# Patient Record
Sex: Female | Born: 1965 | Hispanic: Yes | Marital: Married | Smoking: Current some day smoker
Health system: Southern US, Community
[De-identification: ages and names within clinical notes are randomized; demographics above are authoritative.]

## PROBLEM LIST (undated history)

## (undated) DIAGNOSIS — E039 Hypothyroidism, unspecified: Secondary | ICD-10-CM

## (undated) HISTORY — DX: Hypothyroidism, unspecified: E03.9

---

## 2016-08-25 ENCOUNTER — Other Ambulatory Visit: Payer: Self-pay | Admitting: Family Medicine

## 2016-08-25 ENCOUNTER — Other Ambulatory Visit (HOSPITAL_COMMUNITY)
Admission: RE | Admit: 2016-08-25 | Discharge: 2016-08-25 | Disposition: A | Discharge status: Home / Self Care | Payer: 59 | Source: Ambulatory Visit | Attending: Family Medicine | Admitting: Family Medicine

## 2016-08-25 DIAGNOSIS — Z1151 Encounter for screening for human papillomavirus (HPV): Secondary | ICD-10-CM | POA: Insufficient documentation

## 2016-08-25 DIAGNOSIS — Z01419 Encounter for gynecological examination (general) (routine) without abnormal findings: Secondary | ICD-10-CM | POA: Insufficient documentation

## 2016-09-01 LAB — CYTOLOGY - PAP
DIAGNOSIS: NEGATIVE
HPV (WINDOPATH): DETECTED — AB

## 2017-10-13 ENCOUNTER — Other Ambulatory Visit (HOSPITAL_COMMUNITY)
Admission: RE | Admit: 2017-10-13 | Discharge: 2017-10-13 | Disposition: A | Discharge status: Home / Self Care | Payer: PRIVATE HEALTH INSURANCE | Source: Ambulatory Visit | Attending: Family Medicine | Admitting: Family Medicine

## 2017-10-13 ENCOUNTER — Other Ambulatory Visit: Payer: Self-pay | Admitting: Family Medicine

## 2017-10-13 DIAGNOSIS — Z01411 Encounter for gynecological examination (general) (routine) with abnormal findings: Secondary | ICD-10-CM | POA: Diagnosis not present

## 2017-10-18 LAB — CYTOLOGY - PAP
DIAGNOSIS: NEGATIVE
HPV: NOT DETECTED

## 2018-04-26 ENCOUNTER — Other Ambulatory Visit: Payer: Self-pay | Admitting: Family Medicine

## 2018-04-26 DIAGNOSIS — Z1231 Encounter for screening mammogram for malignant neoplasm of breast: Secondary | ICD-10-CM

## 2018-05-16 ENCOUNTER — Ambulatory Visit
Admission: RE | Admit: 2018-05-16 | Discharge: 2018-05-16 | Disposition: A | Discharge status: Home / Self Care | Payer: PRIVATE HEALTH INSURANCE | Source: Ambulatory Visit | Attending: Family Medicine | Admitting: Family Medicine

## 2018-05-16 ENCOUNTER — Ambulatory Visit: Payer: PRIVATE HEALTH INSURANCE

## 2018-05-16 DIAGNOSIS — Z1231 Encounter for screening mammogram for malignant neoplasm of breast: Secondary | ICD-10-CM

## 2018-05-17 ENCOUNTER — Other Ambulatory Visit: Payer: Self-pay | Admitting: Family Medicine

## 2018-05-17 DIAGNOSIS — R928 Other abnormal and inconclusive findings on diagnostic imaging of breast: Secondary | ICD-10-CM

## 2018-05-22 ENCOUNTER — Ambulatory Visit
Admission: RE | Admit: 2018-05-22 | Discharge: 2018-05-22 | Disposition: A | Discharge status: Home / Self Care | Payer: PRIVATE HEALTH INSURANCE | Source: Ambulatory Visit | Attending: Family Medicine | Admitting: Family Medicine

## 2018-05-22 ENCOUNTER — Other Ambulatory Visit: Payer: Self-pay | Admitting: Family Medicine

## 2018-05-22 DIAGNOSIS — R928 Other abnormal and inconclusive findings on diagnostic imaging of breast: Secondary | ICD-10-CM

## 2018-05-22 DIAGNOSIS — N6320 Unspecified lump in the left breast, unspecified quadrant: Secondary | ICD-10-CM

## 2018-11-27 ENCOUNTER — Ambulatory Visit
Admission: RE | Admit: 2018-11-27 | Discharge: 2018-11-27 | Disposition: A | Discharge status: Home / Self Care | Payer: PRIVATE HEALTH INSURANCE | Source: Ambulatory Visit | Attending: Family Medicine | Admitting: Family Medicine

## 2018-11-27 DIAGNOSIS — N6320 Unspecified lump in the left breast, unspecified quadrant: Secondary | ICD-10-CM

## 2019-05-08 ENCOUNTER — Other Ambulatory Visit: Payer: Self-pay | Admitting: Family Medicine

## 2019-05-08 DIAGNOSIS — N63 Unspecified lump in unspecified breast: Secondary | ICD-10-CM

## 2019-05-18 ENCOUNTER — Other Ambulatory Visit: Payer: Self-pay

## 2019-05-18 ENCOUNTER — Ambulatory Visit
Admission: RE | Admit: 2019-05-18 | Discharge: 2019-05-18 | Disposition: A | Discharge status: Home / Self Care | Payer: BC Managed Care – PPO | Source: Ambulatory Visit | Attending: Family Medicine | Admitting: Family Medicine

## 2019-05-18 ENCOUNTER — Ambulatory Visit
Admission: RE | Admit: 2019-05-18 | Discharge: 2019-05-18 | Disposition: A | Discharge status: Home / Self Care | Payer: PRIVATE HEALTH INSURANCE | Source: Ambulatory Visit | Attending: Family Medicine | Admitting: Family Medicine

## 2019-05-18 DIAGNOSIS — N63 Unspecified lump in unspecified breast: Secondary | ICD-10-CM

## 2019-09-12 ENCOUNTER — Other Ambulatory Visit: Payer: Self-pay | Admitting: Family Medicine

## 2019-09-12 DIAGNOSIS — E039 Hypothyroidism, unspecified: Secondary | ICD-10-CM

## 2019-09-12 DIAGNOSIS — M7989 Other specified soft tissue disorders: Secondary | ICD-10-CM

## 2019-09-20 ENCOUNTER — Ambulatory Visit
Admission: RE | Admit: 2019-09-20 | Discharge: 2019-09-20 | Disposition: A | Discharge status: Home / Self Care | Payer: BC Managed Care – PPO | Source: Ambulatory Visit | Attending: Family Medicine | Admitting: Family Medicine

## 2019-09-20 DIAGNOSIS — M7989 Other specified soft tissue disorders: Secondary | ICD-10-CM

## 2019-09-20 DIAGNOSIS — E039 Hypothyroidism, unspecified: Secondary | ICD-10-CM

## 2019-10-11 ENCOUNTER — Other Ambulatory Visit: Payer: Self-pay | Admitting: Family Medicine

## 2019-10-11 DIAGNOSIS — E042 Nontoxic multinodular goiter: Secondary | ICD-10-CM

## 2019-10-17 ENCOUNTER — Other Ambulatory Visit: Payer: Self-pay

## 2019-10-17 ENCOUNTER — Ambulatory Visit
Admission: RE | Admit: 2019-10-17 | Discharge: 2019-10-17 | Disposition: A | Discharge status: Home / Self Care | Payer: BC Managed Care – PPO | Source: Ambulatory Visit | Attending: Family Medicine | Admitting: Family Medicine

## 2019-10-17 ENCOUNTER — Other Ambulatory Visit (HOSPITAL_COMMUNITY)
Admission: RE | Admit: 2019-10-17 | Discharge: 2019-10-17 | Disposition: A | Discharge status: Home / Self Care | Payer: BC Managed Care – PPO | Source: Ambulatory Visit | Attending: Radiology | Admitting: Radiology

## 2019-10-17 DIAGNOSIS — E042 Nontoxic multinodular goiter: Secondary | ICD-10-CM | POA: Diagnosis not present

## 2019-10-18 LAB — CYTOLOGY - NON PAP

## 2019-11-01 ENCOUNTER — Encounter (HOSPITAL_COMMUNITY): Payer: Self-pay

## 2019-12-01 ENCOUNTER — Ambulatory Visit: Payer: BC Managed Care – PPO | Attending: Internal Medicine

## 2019-12-01 DIAGNOSIS — Z23 Encounter for immunization: Secondary | ICD-10-CM

## 2019-12-01 NOTE — Progress Notes (Signed)
   Covid-19 Vaccination Clinic  Name:  Mallory Moore    MRN: [id number] DOB: [birth year]  12/01/2019  Mallory Moore was observed post Covid-19 immunization for 15 minutes without incidence. She was provided with Vaccine Information Sheet and instruction to access the V-Safe system.   Mallory Moore was instructed to call 911 with any severe reactions post vaccine: Marland Kitchen Difficulty breathing  . Swelling of your face and throat  . A fast heartbeat  . A bad rash all over your body  . Dizziness and weakness    Immunizations Administered    Name Date Dose VIS Date Route   Pfizer COVID-19 Vaccine 12/01/2019  3:57 PM 0.3 mL 09/14/2019 Intramuscular   Manufacturer: [employer], [employer]   Lot: [id number]   NDC: [address]-7

## 2019-12-22 ENCOUNTER — Ambulatory Visit: Payer: BC Managed Care – PPO | Attending: Internal Medicine

## 2019-12-22 DIAGNOSIS — Z23 Encounter for immunization: Secondary | ICD-10-CM

## 2019-12-22 NOTE — Progress Notes (Signed)
   Covid-19 Vaccination Clinic  Name:  Mallory Moore    MRN: [id number] DOB: November 06, 1965  12/22/2019  [name] was observed post Covid-19 immunization for 15 minutes without incident. She was provided with Vaccine Information Sheet and instruction to access the V-Safe system.   [name] was instructed to call 911 with any severe reactions post vaccine: Marland Kitchen Difficulty breathing  . Swelling of face and throat  . A fast heartbeat  . A bad rash all over body  . Dizziness and weakness   Immunizations Administered    Name Date Dose VIS Date Route   Pfizer COVID-19 Vaccine 12/22/2019  9:01 AM 0.3 mL 09/14/2019 Intramuscular   Manufacturer: [employer], [employer]   Lot: [id number]   NDC: [address]-0

## 2020-01-23 ENCOUNTER — Ambulatory Visit: Payer: Self-pay | Admitting: General Surgery

## 2020-01-23 NOTE — H&P (Signed)
History of Present Illness Axel Filler MD; 01/23/2020 3:00 PM) The patient is a 54 year old female who presents with a thyroid nodule. Patient is a 54 year old patient with you female, who comes in secondary to a thyroid goiter and nodules. Patient recently underwent biopsy and a firmer which revealed a 4% possibility of cancer. Patient states she has some dysphagia with eating as well as choking sensation when laying down. She states that she has to accommodate herself in bed to help with breathing. Patient feels that it is large. Patient's thyroid studies are within normal limits.   I did review her laboratory and from her findings.   Past Surgical History Maurilio Lovely, CMA; 01/23/2020 2:38 PM) No pertinent past surgical history   Diagnostic Studies History Maurilio Lovely, CMA; 01/23/2020 2:38 PM) Colonoscopy  never Mammogram  within last year Pap Smear  1-5 years ago  Allergies Maurilio Lovely, CMA; 01/23/2020 2:39 PM) No Known Drug Allergies [01/23/2020]: Allergies Reconciled   Medication History Maurilio Lovely, CMA; 01/23/2020 2:39 PM) Levothyroxine Sodium ( Tablet, Oral) Active. Saxenda (18MG /3ML Soln Pen-inj, Subcutaneous) Active. Medications Reconciled  Social History , CMA; 01/23/2020 2:38 PM) Alcohol use  Remotely quit alcohol use. Caffeine use  Coffee. Tobacco use  Current some day smoker.  Family History 01/25/2020, CMA; 01/23/2020 2:38 PM) First Degree Relatives  No pertinent family history   Pregnancy / Birth History 01/25/2020, CMA; 01/23/2020 2:38 PM) Age at menarche  12 years. Age of menopause  <46 Gravida  3 Length (months) of breastfeeding  12-24 Maternal age  <15 Para  3  Other Problems 09-18-1992, CMA; 01/23/2020 2:38 PM) Anxiety Disorder  Thyroid Disease     Review of Systems 01/25/2020 MD; 01/23/2020 2:57 PM) General Not Present- Appetite Loss, Chills, Fatigue, Fever, Night Sweats,  Weight Gain and Weight Loss. Skin Not Present- Change in Wart/Mole, Dryness, Hives, Jaundice, New Lesions, Non-Healing Wounds, Rash and Ulcer. HEENT Not Present- Earache, Hearing Loss, Hoarseness, Nose Bleed, Oral Ulcers, Ringing in the Ears, Seasonal Allergies, Sinus Pain, Sore Throat, Visual Disturbances, Wears glasses/contact lenses and Yellow Eyes. Respiratory Not Present- Bloody sputum, Chronic Cough, Difficulty Breathing, Snoring and Wheezing. Breast Not Present- Breast Mass, Breast Pain, Nipple Discharge and Skin Changes. Cardiovascular Not Present- Chest Pain, Difficulty Breathing Lying Down, Leg Cramps, Palpitations, Rapid Heart Rate, Shortness of Breath and Swelling of Extremities. Gastrointestinal Not Present- Abdominal Pain, Bloating, Bloody Stool, Change in Bowel Habits, Chronic diarrhea, Constipation, Difficulty Swallowing, Excessive gas, Gets full quickly at meals, Hemorrhoids, Indigestion, Nausea, Rectal Pain and Vomiting. Female Genitourinary Not Present- Frequency, Nocturia, Painful Urination, Pelvic Pain and Urgency. Musculoskeletal Not Present- Back Pain, Joint Pain, Joint Stiffness, Muscle Pain, Muscle Weakness and Swelling of Extremities. Neurological Not Present- Decreased Memory, Fainting, Headaches, Numbness, Seizures, Tingling, Tremor, Trouble walking and Weakness. Psychiatric Not Present- Anxiety, Bipolar, Change in Sleep Pattern, Depression, Fearful and Frequent crying. Endocrine Not Present- Cold Intolerance, Excessive Hunger, Hair Changes, Heat Intolerance, Hot flashes and New Diabetes. Hematology Not Present- Blood Thinners, Easy Bruising, Excessive bleeding, Gland problems, HIV and Persistent Infections. All other systems negative  Vitals 01/25/2020 CMA; 01/23/2020 2:40 PM) 01/23/2020 2:39 PM Weight: 142.2 lb Height: 65in Body Surface Area: 1.71 m Body Mass Index: 23.66 kg/m  Temp.: 97.41F(Tympanic)  Pulse: 88 (Regular)  BP: 126/72 (Sitting, Left  Arm, Standard)       Physical Exam 01/25/2020 MD; 01/23/2020 2:59 PM) The physical exam findings are as follows: Note:Constitutional: No acute distress, conversant, appears stated  age  Eyes: Anicteric sclerae, moist conjunctiva, no lid lag  Neck: trachea midline, no cervical lymphadenopathy, patient with nodules the left side. This area is soft, and moves with swallowing.  Lungs: Clear to auscultation biilaterally, normal respiratory effot  Cardiovascular: regular rate & rhythm, no murmurs, no peripheal edema, pedal pulses 2+  GI: Soft, no masses or hepatosplenomegaly, non-tender to palpation  MSK: Normal gait, no clubbing cyanosis, edema  Skin: No rashes, palpation reveals normal skin turgor  Psychiatric: Appropriate judgment and insight, oriented to person, place, and time    Assessment & Plan Ralene Ok MD; 01/23/2020 3:00 PM) THYROID NODULE (E04.1) Impression: Patient is a 54 year old female with quarter thyroid nodule. Secondary to patient's compression symptoms proceed to the operative for oral thyroidectomy. Discussed the patient that after surgery she would likely require lifelong thyroid supplementation. All risks and benefits were discussed with the patient to generally include: infection, bleeding, damage to the recurrent laryngeal nerve, damage to parathyroid glands, and possible need for further surgery. Alternatives were offered and described. All questions were answered and the patient voiced understanding of the procedure and wishes to proceed.   I reviewed the patient's external notes from the referring physicians as well as consulting physician team. Each of the radiologic studies and lab studies were independently reviewed and interpreted. I discussed the results of the above studies and how they relate to the patient's surgical problems.

## 2020-04-09 ENCOUNTER — Other Ambulatory Visit: Payer: Self-pay | Admitting: Family Medicine

## 2020-04-09 DIAGNOSIS — N6320 Unspecified lump in the left breast, unspecified quadrant: Secondary | ICD-10-CM

## 2020-05-19 ENCOUNTER — Other Ambulatory Visit: Payer: Self-pay

## 2020-05-19 ENCOUNTER — Ambulatory Visit
Admission: RE | Admit: 2020-05-19 | Discharge: 2020-05-19 | Disposition: A | Discharge status: Home / Self Care | Payer: BC Managed Care – PPO | Source: Ambulatory Visit | Attending: Family Medicine | Admitting: Family Medicine

## 2020-05-19 DIAGNOSIS — N6320 Unspecified lump in the left breast, unspecified quadrant: Secondary | ICD-10-CM

## 2020-12-05 ENCOUNTER — Other Ambulatory Visit (HOSPITAL_COMMUNITY)
Admission: RE | Admit: 2020-12-05 | Discharge: 2020-12-05 | Disposition: A | Discharge status: Home / Self Care | Payer: BC Managed Care – PPO | Source: Ambulatory Visit | Attending: Family Medicine | Admitting: Family Medicine

## 2020-12-05 ENCOUNTER — Other Ambulatory Visit: Payer: Self-pay | Admitting: Family Medicine

## 2020-12-05 DIAGNOSIS — Z01419 Encounter for gynecological examination (general) (routine) without abnormal findings: Secondary | ICD-10-CM | POA: Insufficient documentation

## 2020-12-09 LAB — CYTOLOGY - PAP: Diagnosis: NEGATIVE

## 2021-05-01 ENCOUNTER — Other Ambulatory Visit: Payer: Self-pay | Admitting: Family Medicine

## 2021-05-01 DIAGNOSIS — Z1231 Encounter for screening mammogram for malignant neoplasm of breast: Secondary | ICD-10-CM

## 2021-06-22 ENCOUNTER — Other Ambulatory Visit: Payer: Self-pay

## 2021-06-22 ENCOUNTER — Ambulatory Visit
Admission: RE | Admit: 2021-06-22 | Discharge: 2021-06-22 | Disposition: A | Discharge status: Home / Self Care | Payer: BC Managed Care – PPO | Source: Ambulatory Visit | Attending: Family Medicine | Admitting: Family Medicine

## 2021-06-22 DIAGNOSIS — Z1231 Encounter for screening mammogram for malignant neoplasm of breast: Secondary | ICD-10-CM

## 2021-07-30 IMAGING — US US THYROID
1 series · 13 of 25 positions shown · non-contrast
Comparison: None.

CLINICAL DATA: Neck mass

EXAM:
THYROID ULTRASOUND
TECHNIQUE: Ultrasound examination of the thyroid gland and adjacent soft
tissues was performed.

[Series 1: us thyroid · 0.04mm/px · 13 of 44 slices shown]
[im 1/44]
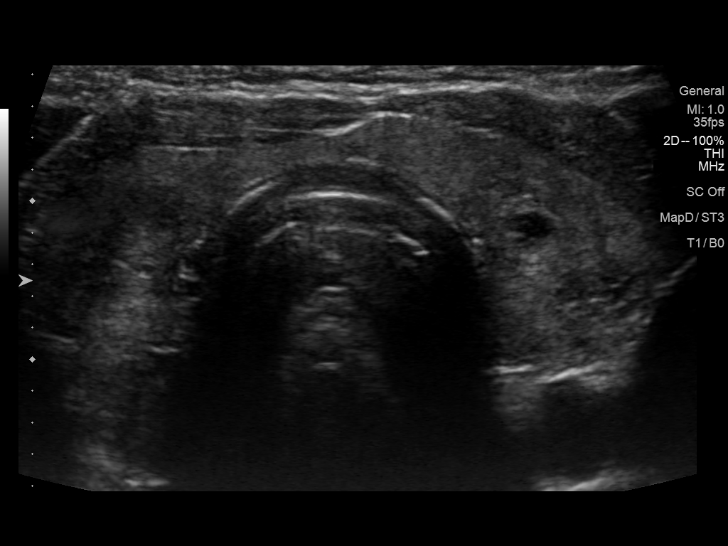
[im 4/44]
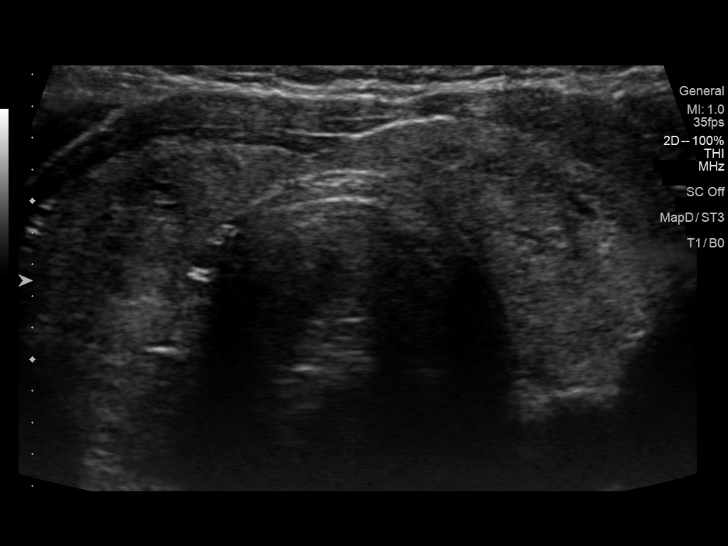
[im 8/44]
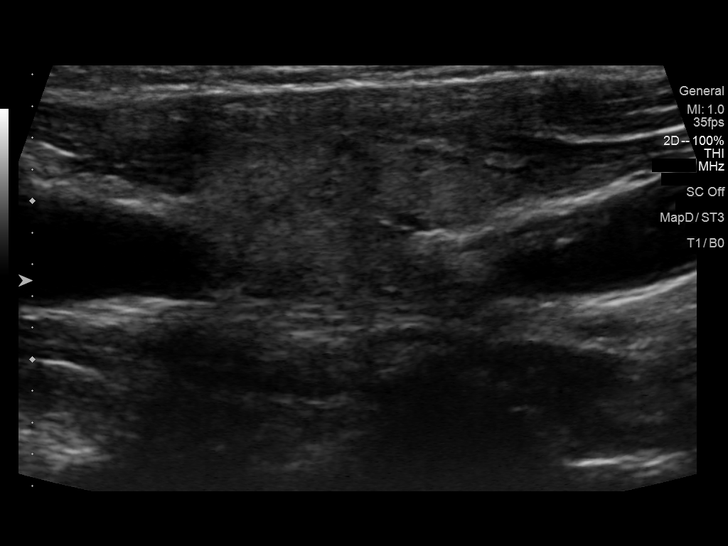
[im 11/44]
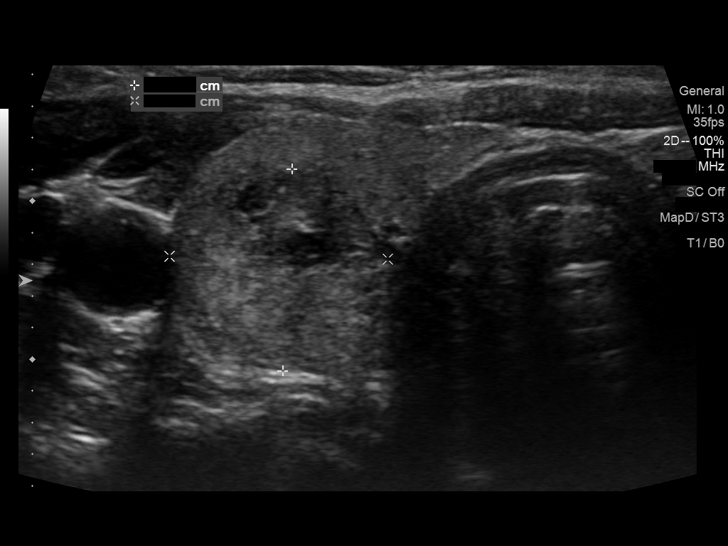
[im 15/44]
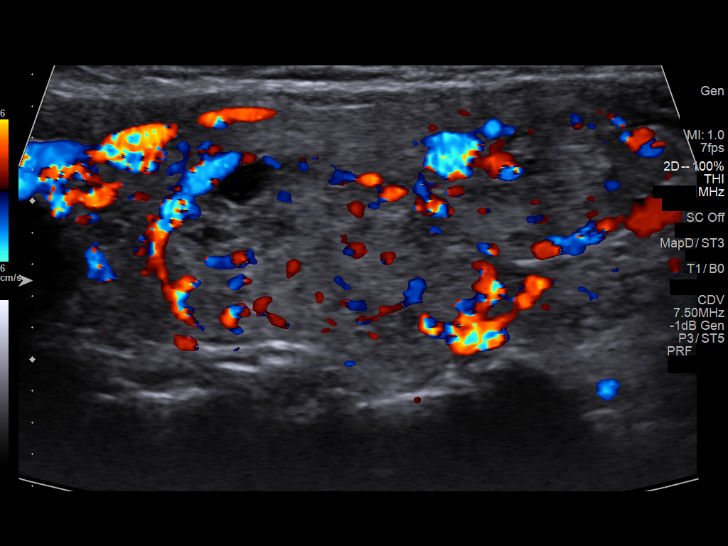
[im 18/44]
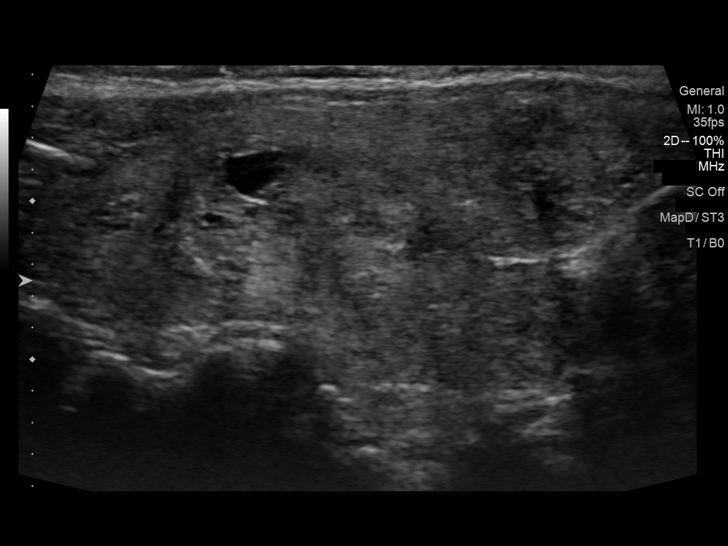
[im 22/44]
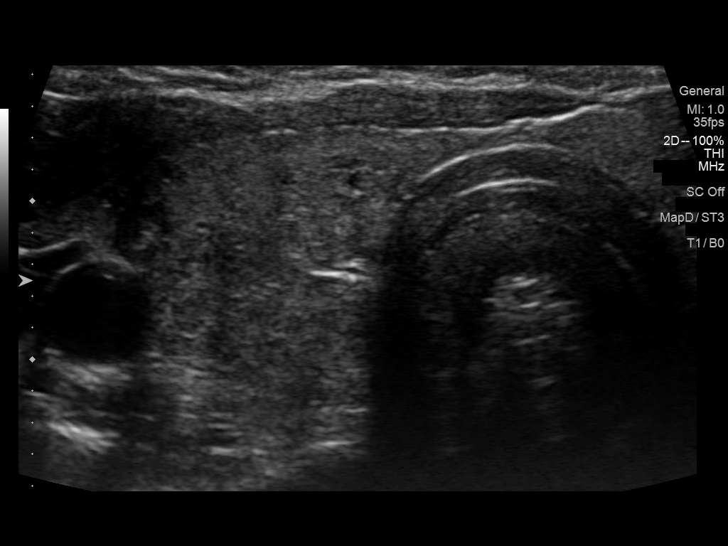
[im 26/44]
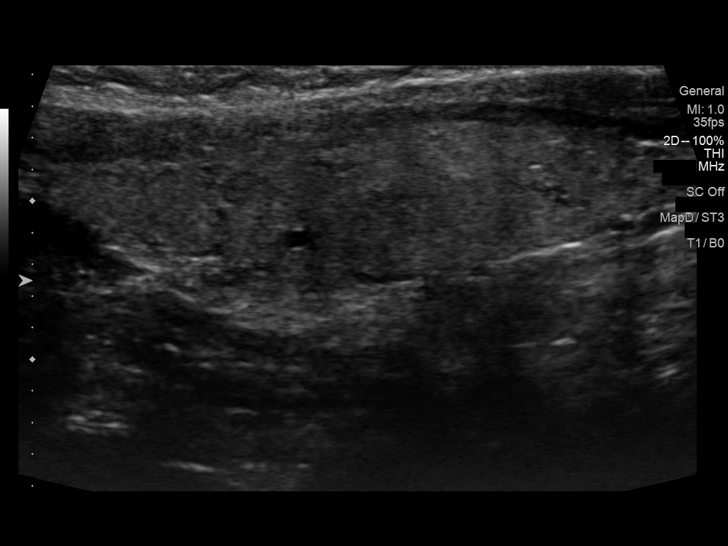
[im 29/44]
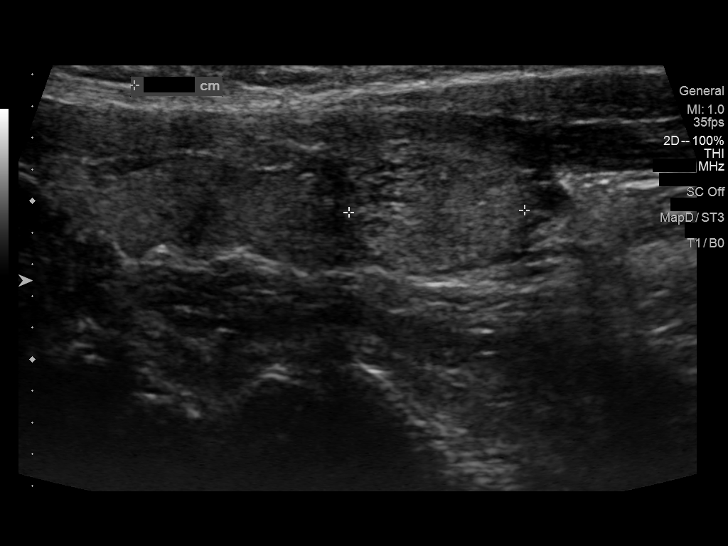
[im 33/44]
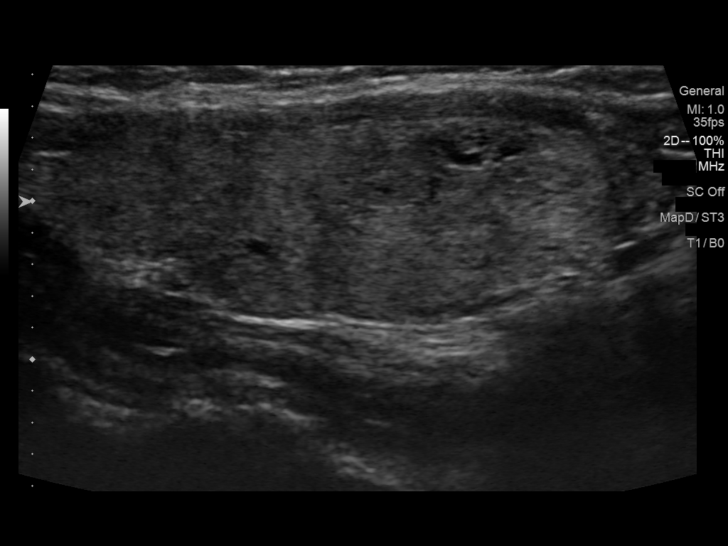
[im 36/44]
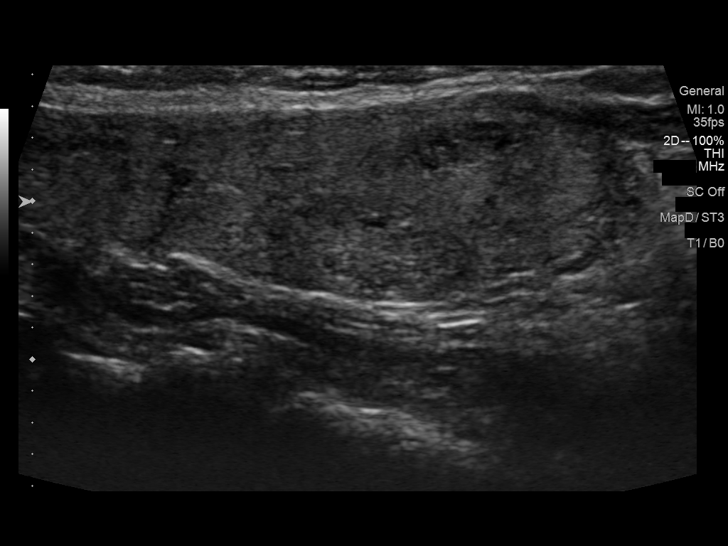
[im 40/44]
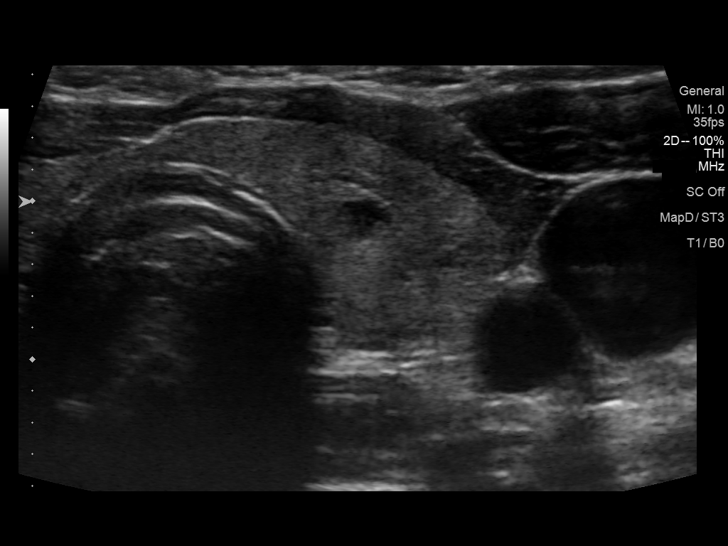
[im 44/44]
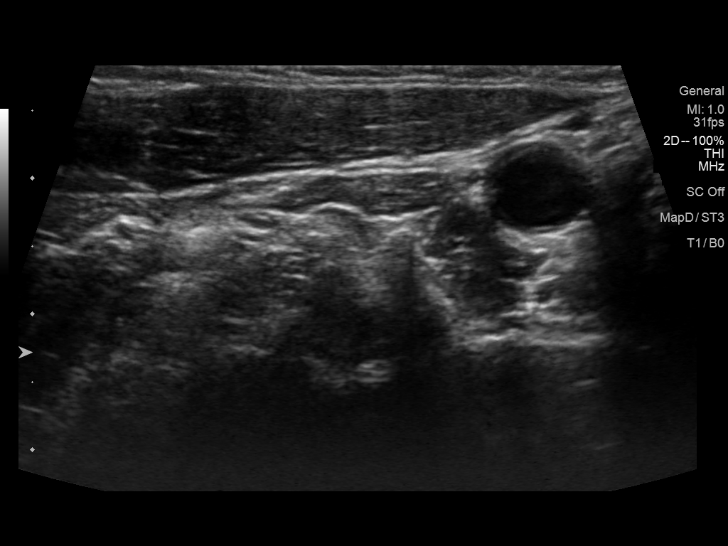

[13 of 25 positions shown; findings below may reference images not displayed]

FINDINGS: Parenchymal Echotexture: Moderately heterogenous

Isthmus: 0.2 cm

Right lobe: 3.8 x 1.9 x 1.4 cm

Left lobe: 4 x 1 x 1.3 cm

_________________________________________________________

Estimated total number of nodules >/= 1 cm: 4

Number of spongiform nodules >/=  2 cm not described below (TR1): 0

Number of mixed cystic and solid nodules >/= 1.5 cm not described
below (TR2): 0

_________________________________________________________

Nodule # 1:

Location: Right; Mid

Maximum size: 2.2 cm; Other 2 dimensions: 1.4 x 1.3 cm

Composition: solid/almost completely solid (2)

Echogenicity: isoechoic (1)

Shape: not taller-than-wide (0)

Margins: smooth (0)

Echogenic foci: none (0)

ACR TI-RADS total points: 3.

ACR TI-RADS risk category: TR3 (3 points).

ACR TI-RADS recommendations:

**Given size (>/= 2.5 cm) and appearance, fine needle aspiration of
this mildly suspicious nodule should be considered based on TI-RADS
criteria.

_________________________________________________________

Nodule # 2:

Location: Right; Inferior

Maximum size: 1 cm; Other 2 dimensions: 0.9 x 0 cm

Composition: solid/almost completely solid (2)

Echogenicity: isoechoic (1)

Shape: not taller-than-wide (0)

Margins: smooth (0)

Echogenic foci: none (0)

ACR TI-RADS total points: 3.

ACR TI-RADS risk category: TR3 (3 points).

ACR TI-RADS recommendations:

Given size (<1.4 cm) and appearance, this nodule does NOT meet
TI-RADS criteria for biopsy or dedicated follow-up.

_________________________________________________________

There is a 1.1 cm isoechoic nodule in the left mid thyroid gland as
well as a 1.3 cm isoechoic nodule in the left inferior thyroid
gland. Neither of these nodules meet criteria for FNA or follow-up.

_________________________________________________________

There are no pathologically enlarged lymph nodes.
IMPRESSION: 1. Moderately heterogeneous normal-sized thyroid gland with multiple
distinct thyroid nodules as detailed above.
2. There is a 2.2 cm thyroid nodule in the right mid thyroid gland
that meets criteria for FNA.
3. The remaining thyroid nodules do not meet criteria for FNA or
follow-up ultrasound.

The above is in keeping with the ACR TI-RADS recommendations - [HOSPITAL] 1170;[DATE].

## 2022-06-08 ENCOUNTER — Other Ambulatory Visit: Payer: Self-pay | Admitting: Family Medicine

## 2022-06-08 DIAGNOSIS — Z1231 Encounter for screening mammogram for malignant neoplasm of breast: Secondary | ICD-10-CM

## 2022-06-24 ENCOUNTER — Ambulatory Visit
Admission: RE | Admit: 2022-06-24 | Discharge: 2022-06-24 | Disposition: A | Discharge status: Home / Self Care | Payer: BC Managed Care – PPO | Source: Ambulatory Visit | Attending: Family Medicine | Admitting: Family Medicine

## 2022-06-24 DIAGNOSIS — Z1231 Encounter for screening mammogram for malignant neoplasm of breast: Secondary | ICD-10-CM

## 2022-08-16 ENCOUNTER — Other Ambulatory Visit: Payer: Self-pay | Admitting: *Deleted

## 2022-08-16 DIAGNOSIS — I8393 Asymptomatic varicose veins of bilateral lower extremities: Secondary | ICD-10-CM

## 2022-08-20 ENCOUNTER — Ambulatory Visit: Payer: BC Managed Care – PPO | Admitting: Physician Assistant

## 2022-08-20 ENCOUNTER — Ambulatory Visit (HOSPITAL_COMMUNITY)
Admission: RE | Admit: 2022-08-20 | Discharge: 2022-08-20 | Disposition: A | Discharge status: Home / Self Care | Payer: BC Managed Care – PPO | Source: Ambulatory Visit | Attending: Vascular Surgery | Admitting: Vascular Surgery

## 2022-08-20 VITALS — BP 147/95 | HR 62 | Temp 98.1°F | Ht 63.0 in | Wt 150.0 lb

## 2022-08-20 DIAGNOSIS — I872 Venous insufficiency (chronic) (peripheral): Secondary | ICD-10-CM | POA: Diagnosis not present

## 2022-08-20 DIAGNOSIS — I8393 Asymptomatic varicose veins of bilateral lower extremities: Secondary | ICD-10-CM

## 2022-08-20 NOTE — Progress Notes (Signed)
[facility] [place]   Reason for referral: Swollen left  leg  History of Present Illness  Mallory Moore is a 56 y.o. female who presents with chief complaint: swollen leg.  Patient notes, onset of swelling  months ago, associated with prolonged standing @work .  The patient has had no history of DVT, no history of varicose vein, no history of venous stasis ulcers, no history of  Lymphedema and no history of skin changes in lower legs.  There is positive family history of venous disorders.  The patient has  used compression stockings in the past.  She has a job where she is on her feet all day.  She wears thigh high compression daily.  She notices edema after she takes the compression off in the evening.  She states her legs left > right is achy and it makes it hard to fall asleep.  She has a patch of spider veins medial left knee that bothers her as well as she doesn't like the look of it. She denise claudication, rest pain or non healing wounds.  Past Medical History:  Diagnosis Date   Hypothyroidism     History reviewed. No pertinent surgical history.  Social History   Socioeconomic History   Marital status: Married    Spouse name: Not on file   Number of children: Not on file   Years of education: Not on file   Highest education level: Not on file  Occupational History   Not on file  Tobacco Use   Smoking status: Some Days    Packs/day: 0.10    Years: 40.00    Total pack years: 4.00    Types: Cigarettes   Smokeless tobacco: Never  Vaping Use   Vaping Use: Never used  Substance and Sexual Activity   Alcohol use: Yes    Alcohol/week: 1.0 standard drink of alcohol    Types: 1 Standard drinks or equivalent per week   Drug use: Never   Sexual activity: Not on file  Other Topics Concern   Not on file  Social History Narrative   Not on file   Social Determinants of Health   Financial Resource Strain: Not on file  Food Insecurity: Not on file   Transportation Needs: Not on file  Physical Activity: Not on file  Stress: Not on file  Social Connections: Not on file  Intimate Partner Violence: Not on file    History reviewed. No pertinent family history.  Current Outpatient Medications on File Prior to Visit  Medication Sig Dispense Refill   levothyroxine (SYNTHROID) 100 MCG tablet Take 100 mcg by mouth every morning.     No current facility-administered medications on file prior to visit.    Allergies as of 08/20/2022   (No Known Allergies)     ROS:   General:  No weight loss, Fever, chills  HEENT: No recent headaches, no nasal bleeding, no visual changes, no sore throat  Neurologic: No dizziness, blackouts, seizures. No recent symptoms of stroke or mini- stroke. No recent episodes of slurred speech, or temporary blindness.  Cardiac: No recent episodes of chest pain/pressure, no shortness of breath at rest.  No shortness of breath with exertion.  Denies history of atrial fibrillation or irregular heartbeat  Vascular: No history of rest pain in feet.  No history of claudication.  No history of non-healing ulcer, No history of DVT   Pulmonary: No home oxygen, no productive cough, no hemoptysis,  No asthma or wheezing  Musculoskeletal:  [ ]  Arthritis, [ ]  Low back pain,  [ ]  Joint pain  Hematologic:No history of hypercoagulable state.  No history of easy bleeding.  No history of anemia  Gastrointestinal: No hematochezia or melena,  No gastroesophageal reflux, no trouble swallowing  Urinary: [ ]  chronic Kidney disease, [ ]  on HD - [ ]  MWF or [ ]  TTHS, [ ]  Burning with urination, [ ]  Frequent urination, [ ]  Difficulty urinating;   Skin: No rashes  Psychological: No history of anxiety,  No history of depression  Physical Examination  Vitals:   08/20/22 1303  BP: (!) 147/95  Pulse: 62  Temp: 98.1 F (36.7 C)  SpO2: 99%  Weight: 150 lb (68 kg)  Height: 5\' 3"  (1.6 m)    Body mass index is 26.57  kg/m.  General:  Alert and oriented, no acute distress HEENT: Normal Neck: No bruit or JVD Pulmonary: Clear to auscultation bilaterally Cardiac: Regular Rate and Rhythm without murmur Abdomen: Soft, non-tender, non-distended, no mass, no scars Skin: No rash, medial left knee patch of spider veins.   Extremity Pulses:   radial,  femoral, dorsalis pedis,  pulses bilaterally Musculoskeletal: No deformity or minimal sock marks edema B LE  Neurologic: Upper and lower extremity motor 5/5 and symmetric  DATA:  +--------------+---------+------+-----------+------------+-------------+  LEFT         Reflux NoRefluxReflux TimeDiameter cmsComments                               Yes                                        +--------------+---------+------+-----------+------------+-------------+  CFV                    yes   >1 second                            +--------------+---------+------+-----------+------------+-------------+  FV mid        no                                                   +--------------+---------+------+-----------+------------+-------------+  Popliteal    no                                                   +--------------+---------+------+-----------+------------+-------------+  GSV at [facility]    no                            0.42                   +--------------+---------+------+-----------+------------+-------------+  GSV prox thigh          yes    >500 ms      0.32                   +--------------+---------+------+-----------+------------+-------------+  GSV mid thigh           yes    >500  ms      0.28                   +--------------+---------+------+-----------+------------+-------------+  GSV dist thigh          yes    >500 ms      0.24    Back in fossa  +--------------+---------+------+-----------+------------+-------------+  GSV at knee             yes    >500 ms      0.21                    +--------------+---------+------+-----------+------------+-------------+  GSV prox calf                               0.23                   +--------------+---------+------+-----------+------------+-------------+  GSV mid calf            yes    >500 ms      0.18                   +--------------+---------+------+-----------+------------+-------------+  SSV Pop Fossa no                                    too small      +--------------+---------+------+-----------+------------+-------------+  SSV prox calf no                            0.12                   +--------------+---------+------+-----------+------------+-------------+  SSV mid calf  no                            0.27                   +--------------+---------+------+-----------+------------+-------------+  AASV O                                              NV             +--------------+---------+------+-----------+------------+-------------+         Summary:  Left:  - No evidence of deep vein thrombosis seen in the left lower extremity,  from the common femoral through the popliteal veins.  - No evidence of superficial venous reflux seen in the left short  saphenous vein.  - Venous reflux is noted in the left common femoral vein.  - Venous reflux is noted in the left sapheno-femoral junction.  - Venous reflux is noted in the left greater saphenous vein in the thigh.  - Venous reflux is noted in the left greater saphenous vein in the calf.   Assessment: Mild venous reflux left LE Patch of spider veins medial knee left LE She does not have SFJ reflux and her GSV is small with areas of reflux There is Deep reflux in the CFV  She has palpable pedal pulses B DP.  She is not at risk of limb loss.  I introduced her to [place] our RN for consideration of sclero therapy for the spider veins.  Plan:She will wear compression daily, elevation when at rest.  She should start and  exercise program and include stretching.  She may has restless leg syndrome.  If she has increased edema, weeping of the skin or non healing wounds on the lower legs she will call us and we can repeat the study.  F/U PRN  Mosetta Pigeon PA-C [facility] [place] Office: [phone]  MD in clinic [place]

## 2022-09-07 ENCOUNTER — Ambulatory Visit (INDEPENDENT_AMBULATORY_CARE_PROVIDER_SITE_OTHER): Payer: BC Managed Care – PPO

## 2022-09-07 DIAGNOSIS — I8392 Asymptomatic varicose veins of left lower extremity: Secondary | ICD-10-CM

## 2022-09-07 NOTE — Progress Notes (Signed)
Treated pt's LLE with Asclera 1% administered with a 27g butterfly.  Patient received a total of 2 mL. She tolerated very well. Area may need more treatments, which we discussed with her and husband. Interpretor service device was used for entire duration of this appt.  Anticipate good outcome. Pt was given post treatment care instructions on handout and verbally. Pt will call with any questions/concerns.   Photos: Yes.    Compression stockings applied: Yes.

## 2023-05-02 IMAGING — MG MM DIGITAL SCREENING BILAT W/ TOMO AND CAD
8 series · 8 of 24 positions shown · non-contrast
Comparison: Previous exam(s).

CLINICAL DATA: Screening.

EXAM:
DIGITAL SCREENING BILATERAL MAMMOGRAM WITH TOMOSYNTHESIS AND CAD
TECHNIQUE: Bilateral screening digital craniocaudal and mediolateral oblique
mammograms were obtained. Bilateral screening digital breast
tomosynthesis was performed. The images were evaluated with
computer-aided detection.

[L MLO synth-2D]
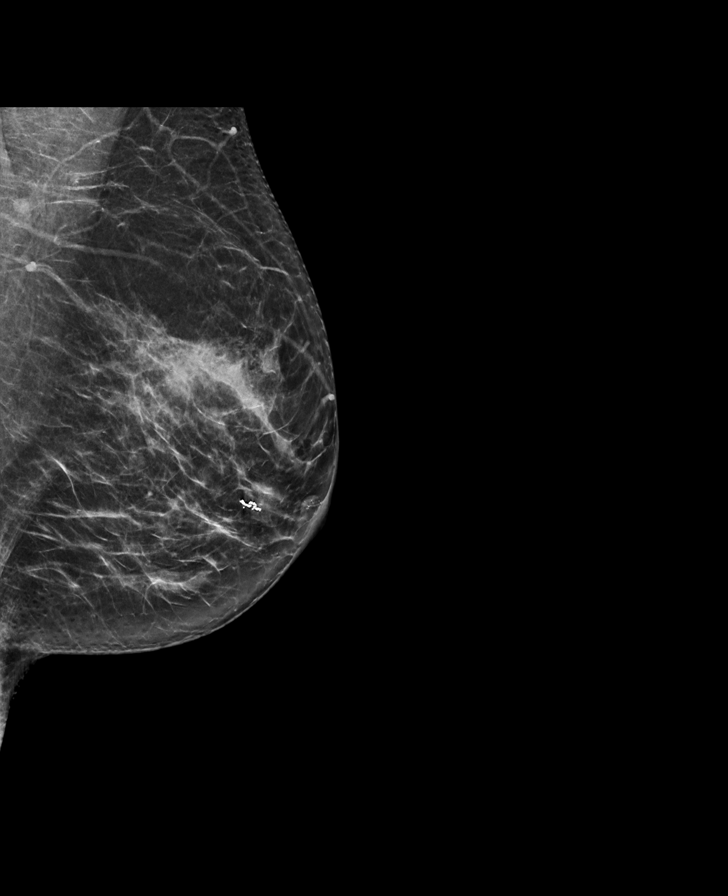

[L CC synth-2D]
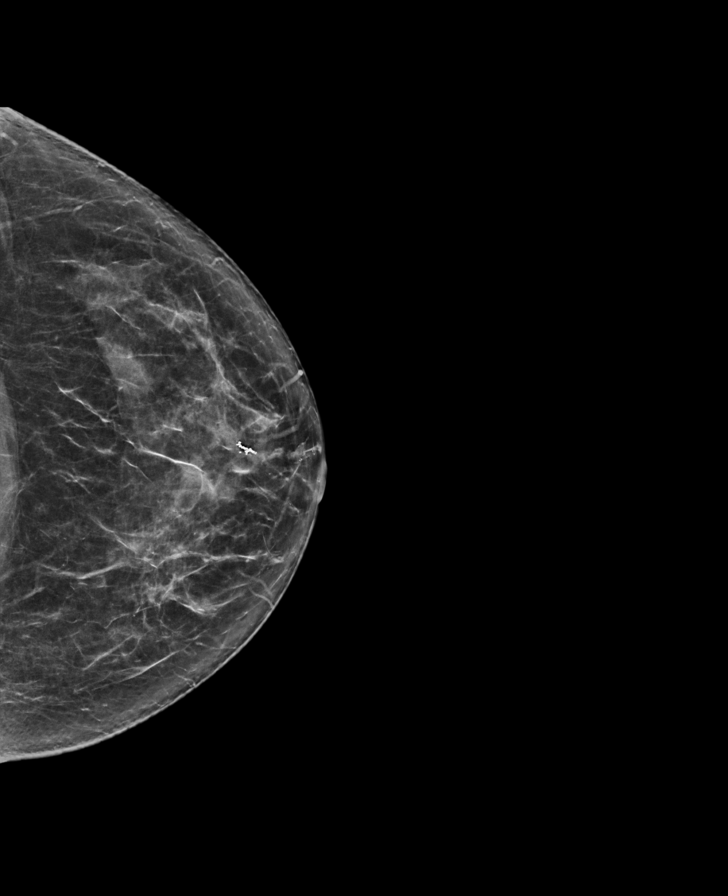

[R MLO synth-2D]
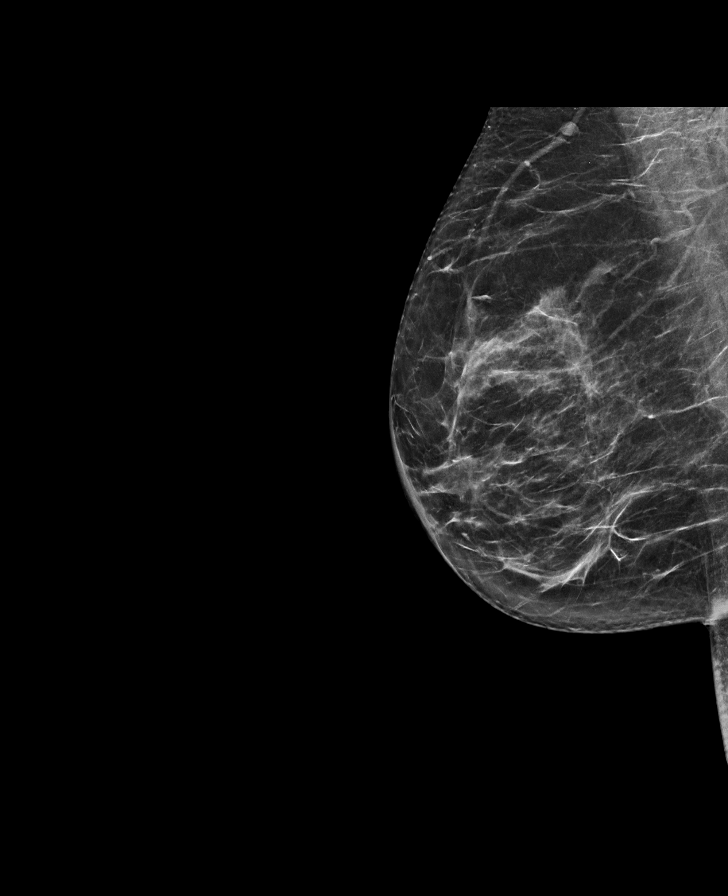

[R CC synth-2D]
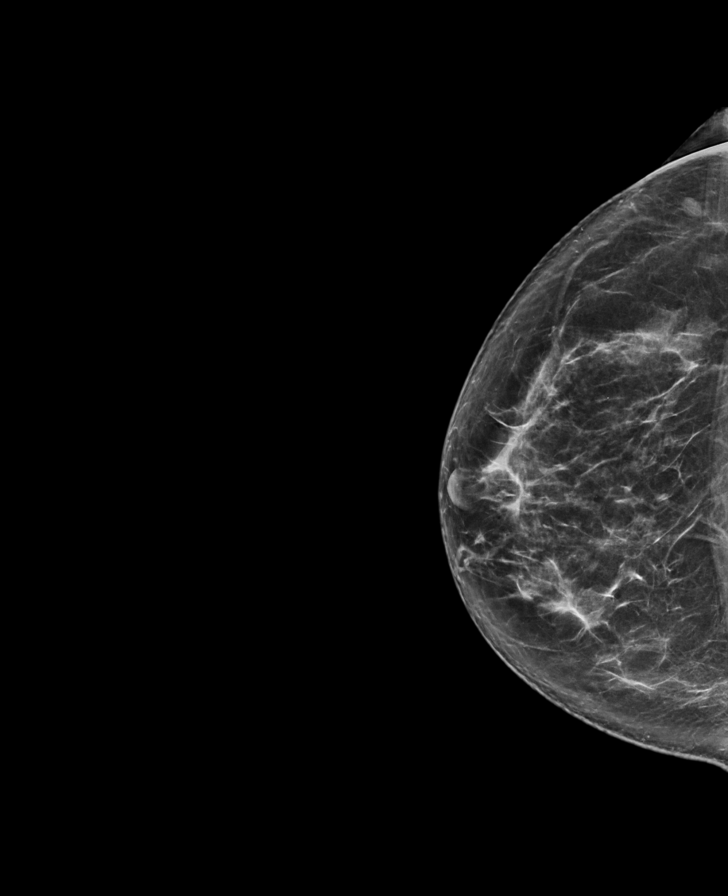

[L CC tomo · tomo slice 36/71.0]
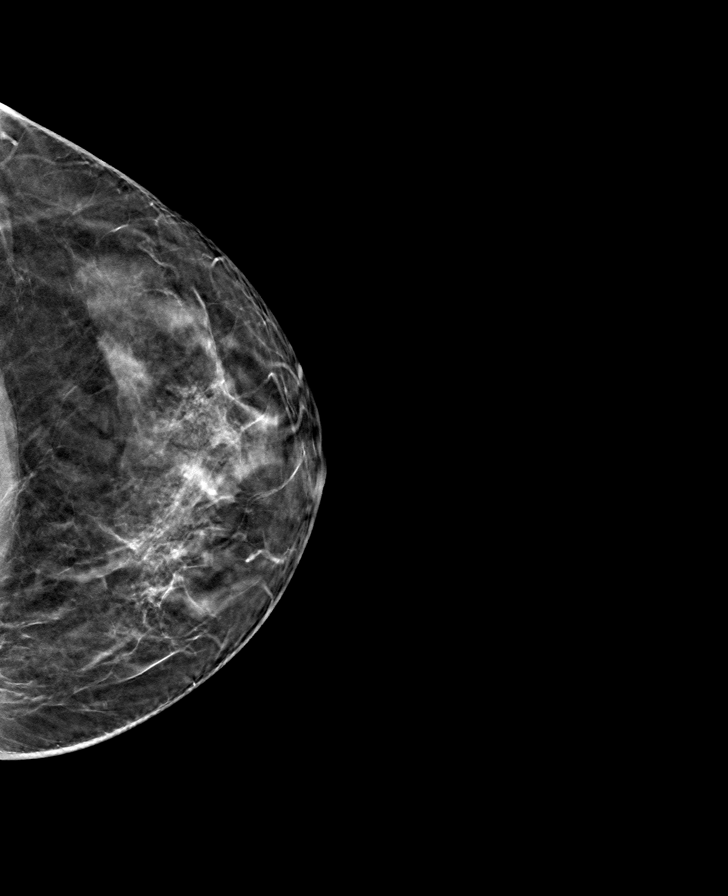

[L MLO tomo · tomo slice 35/68.0]
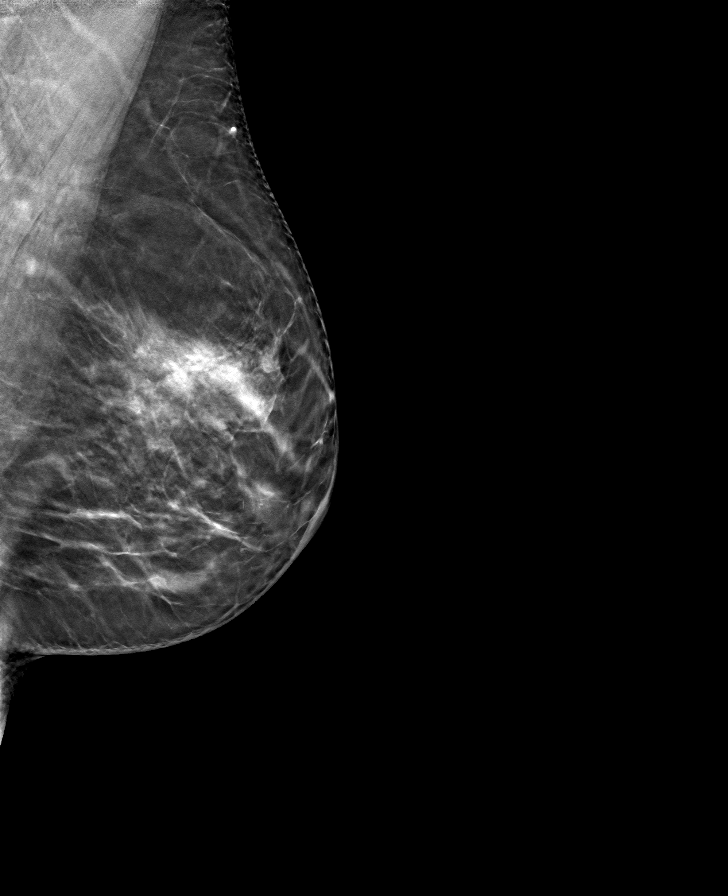

[R CC tomo · tomo slice 39/77.0]
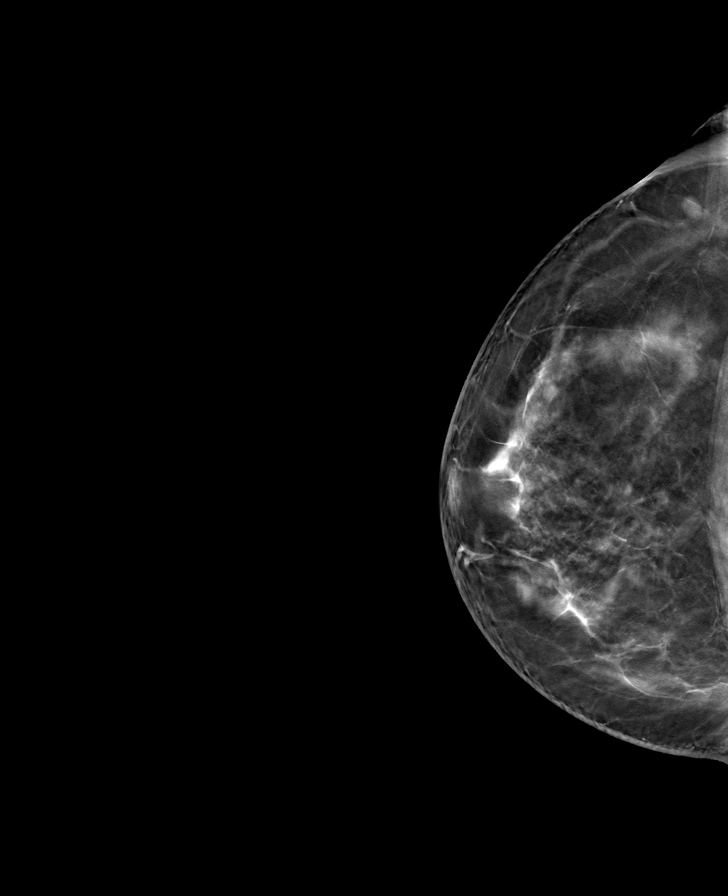

[R MLO tomo · tomo slice 35/70.0]
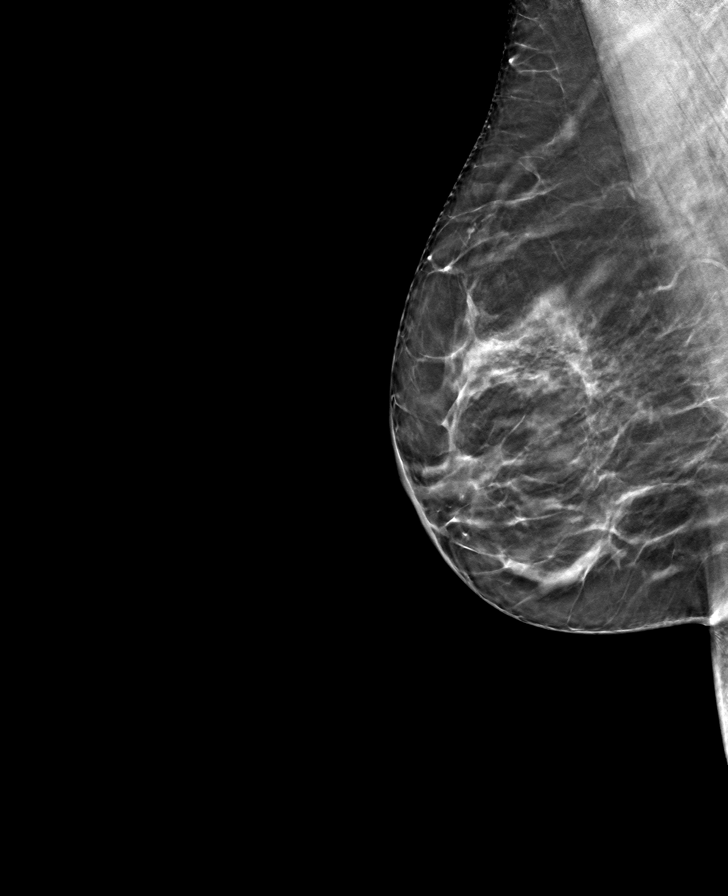

[8 of 24 positions shown; findings below may reference images not displayed]

ACR Breast Density Category c: The breast tissue is heterogeneously
dense, which may obscure small masses.
FINDINGS: There are no findings suspicious for malignancy.
IMPRESSION: No mammographic evidence of malignancy. A result letter of this
screening mammogram will be mailed directly to the patient.

RECOMMENDATION:
Screening mammogram in one year. (Code:Q3-W-BC3)

BI-RADS CATEGORY  1: Negative.

## 2023-05-10 ENCOUNTER — Ambulatory Visit (INDEPENDENT_AMBULATORY_CARE_PROVIDER_SITE_OTHER): Payer: Self-pay

## 2023-05-10 DIAGNOSIS — I8393 Asymptomatic varicose veins of bilateral lower extremities: Secondary | ICD-10-CM

## 2023-05-10 NOTE — Progress Notes (Addendum)
Treated pt's LLE small reticular and spider veins with Asclera 1%, using a 27 gauge butterfly needle, pt received 2 mL (20 mg). Interpretor present entire duration of visit-Andrea Saker from [employer] ([facility]).  Pt tolerated very well. Her previous treatment showed much improvement, with some staining around the larger vein treated. I explained this to her and to avoid sunlight on that area. I did not retreat that vein. Pt was put in thigh high compression hose at the end of her treatment. She was given post treatment care instructions both verbally and on hand out.

## 2023-05-17 ENCOUNTER — Other Ambulatory Visit: Payer: Self-pay | Admitting: Family Medicine

## 2023-05-17 DIAGNOSIS — Z1231 Encounter for screening mammogram for malignant neoplasm of breast: Secondary | ICD-10-CM

## 2023-06-28 ENCOUNTER — Ambulatory Visit: Payer: BC Managed Care – PPO

## 2023-06-29 ENCOUNTER — Ambulatory Visit
Admission: RE | Admit: 2023-06-29 | Discharge: 2023-06-29 | Disposition: A | Discharge status: Home / Self Care | Payer: BC Managed Care – PPO | Source: Ambulatory Visit | Attending: Family Medicine | Admitting: Family Medicine

## 2023-06-29 ENCOUNTER — Ambulatory Visit: Payer: BC Managed Care – PPO

## 2023-06-29 DIAGNOSIS — Z1231 Encounter for screening mammogram for malignant neoplasm of breast: Secondary | ICD-10-CM

## 2024-01-13 ENCOUNTER — Other Ambulatory Visit: Payer: Self-pay | Admitting: Family Medicine

## 2024-01-13 ENCOUNTER — Other Ambulatory Visit (HOSPITAL_COMMUNITY)
Admission: RE | Admit: 2024-01-13 | Discharge: 2024-01-13 | Disposition: A | Discharge status: Home / Self Care | Payer: Self-pay | Source: Ambulatory Visit | Attending: Family Medicine | Admitting: Family Medicine

## 2024-01-13 DIAGNOSIS — Z01411 Encounter for gynecological examination (general) (routine) with abnormal findings: Secondary | ICD-10-CM | POA: Insufficient documentation

## 2024-01-19 LAB — CYTOLOGY - PAP
Comment: NEGATIVE
Diagnosis: NEGATIVE
High risk HPV: NEGATIVE

## 2024-05-24 ENCOUNTER — Other Ambulatory Visit: Payer: Self-pay | Admitting: Family Medicine

## 2024-05-24 DIAGNOSIS — Z1231 Encounter for screening mammogram for malignant neoplasm of breast: Secondary | ICD-10-CM

## 2024-06-29 ENCOUNTER — Ambulatory Visit
Admission: RE | Admit: 2024-06-29 | Discharge: 2024-06-29 | Disposition: A | Discharge status: Home / Self Care | Payer: Self-pay | Source: Ambulatory Visit | Attending: Family Medicine | Admitting: Family Medicine

## 2024-06-29 DIAGNOSIS — Z1231 Encounter for screening mammogram for malignant neoplasm of breast: Secondary | ICD-10-CM

## 2024-09-06 ENCOUNTER — Other Ambulatory Visit: Payer: Self-pay | Admitting: Family Medicine

## 2024-09-06 DIAGNOSIS — R2232 Localized swelling, mass and lump, left upper limb: Secondary | ICD-10-CM

## 2024-09-17 ENCOUNTER — Inpatient Hospital Stay: Admission: RE | Admit: 2024-09-17 | Discharge: 2024-09-17 | Attending: Family Medicine

## 2024-09-17 DIAGNOSIS — R2232 Localized swelling, mass and lump, left upper limb: Secondary | ICD-10-CM
